# Patient Record
Sex: Female | Born: 1970 | Race: Black or African American | Hispanic: No | Marital: Married
Health system: Southern US, Community
[De-identification: ages and names within clinical notes are randomized; demographics above are authoritative.]

## PROBLEM LIST (undated history)

## (undated) DIAGNOSIS — E119 Type 2 diabetes mellitus without complications: Secondary | ICD-10-CM

## (undated) DIAGNOSIS — I5030 Unspecified diastolic (congestive) heart failure: Secondary | ICD-10-CM

## (undated) DIAGNOSIS — I10 Essential (primary) hypertension: Secondary | ICD-10-CM

## (undated) HISTORY — DX: Essential (primary) hypertension: I10

## (undated) HISTORY — DX: Unspecified diastolic (congestive) heart failure: I50.30

## (undated) HISTORY — DX: Type 2 diabetes mellitus without complications: E11.9

---

## 1998-01-07 ENCOUNTER — Other Ambulatory Visit: Admission: RE | Admit: 1998-01-07 | Discharge: 1998-01-07 | Payer: Self-pay | Admitting: Family Medicine

## 2015-07-02 ENCOUNTER — Other Ambulatory Visit (HOSPITAL_COMMUNITY): Payer: Self-pay | Admitting: Internal Medicine

## 2015-07-02 DIAGNOSIS — I10 Essential (primary) hypertension: Secondary | ICD-10-CM

## 2015-07-03 ENCOUNTER — Ambulatory Visit (HOSPITAL_COMMUNITY)
Admission: RE | Admit: 2015-07-03 | Discharge: 2015-07-03 | Disposition: A | Discharge status: Home / Self Care | Payer: Managed Care, Other (non HMO) | Source: Ambulatory Visit | Attending: Plastic Surgery | Admitting: Plastic Surgery

## 2015-07-03 DIAGNOSIS — I10 Essential (primary) hypertension: Secondary | ICD-10-CM | POA: Insufficient documentation

## 2015-07-03 NOTE — Progress Notes (Signed)
*  PRELIMINARY RESULTS* Vascular Ultrasound Renal Artery Duplex has been completed.  Preliminary findings: No evidence of renal artery stenosis.    Farrel DemarkJill Eunice, RDMS, RVT  07/03/2015, 9:43 AM

## 2020-03-16 ENCOUNTER — Other Ambulatory Visit: Payer: Self-pay

## 2020-03-16 ENCOUNTER — Other Ambulatory Visit: Payer: Self-pay | Admitting: Internal Medicine

## 2020-03-16 ENCOUNTER — Ambulatory Visit
Admission: RE | Admit: 2020-03-16 | Discharge: 2020-03-16 | Disposition: A | Discharge status: Home / Self Care | Payer: Commercial Managed Care - PPO | Source: Ambulatory Visit | Attending: Internal Medicine | Admitting: Internal Medicine

## 2020-03-16 DIAGNOSIS — Z1231 Encounter for screening mammogram for malignant neoplasm of breast: Secondary | ICD-10-CM

## 2021-01-19 ENCOUNTER — Other Ambulatory Visit: Payer: Self-pay | Admitting: Internal Medicine

## 2021-01-19 ENCOUNTER — Other Ambulatory Visit: Payer: Self-pay

## 2021-01-19 ENCOUNTER — Ambulatory Visit: Admission: RE | Admit: 2021-01-19 | Payer: Commercial Managed Care - PPO | Source: Ambulatory Visit

## 2021-01-19 DIAGNOSIS — Z1231 Encounter for screening mammogram for malignant neoplasm of breast: Secondary | ICD-10-CM

## 2021-06-23 ENCOUNTER — Ambulatory Visit
Admission: RE | Admit: 2021-06-23 | Discharge: 2021-06-23 | Disposition: A | Discharge status: Home / Self Care | Payer: Commercial Managed Care - PPO | Source: Ambulatory Visit | Attending: Internal Medicine | Admitting: Internal Medicine

## 2021-06-23 ENCOUNTER — Other Ambulatory Visit: Payer: Self-pay

## 2021-06-23 DIAGNOSIS — Z1231 Encounter for screening mammogram for malignant neoplasm of breast: Secondary | ICD-10-CM

## 2022-08-09 ENCOUNTER — Other Ambulatory Visit: Payer: Self-pay | Admitting: Internal Medicine

## 2022-08-09 DIAGNOSIS — Z1231 Encounter for screening mammogram for malignant neoplasm of breast: Secondary | ICD-10-CM

## 2022-08-23 ENCOUNTER — Ambulatory Visit
Admission: RE | Admit: 2022-08-23 | Discharge: 2022-08-23 | Disposition: A | Discharge status: Home / Self Care | Payer: BC Managed Care – PPO | Source: Ambulatory Visit | Attending: Internal Medicine | Admitting: Internal Medicine

## 2022-08-23 DIAGNOSIS — Z1231 Encounter for screening mammogram for malignant neoplasm of breast: Secondary | ICD-10-CM

## 2022-11-26 IMAGING — MG DIGITAL SCREENING BILAT W/ TOMO W/ CAD
8 series · 8 of 24 positions shown · non-contrast
Comparison: Previous exam(s).

CLINICAL DATA: Screening.

EXAM:
DIGITAL SCREENING BILATERAL MAMMOGRAM WITH TOMO AND CAD

[L MLO synth-2D]
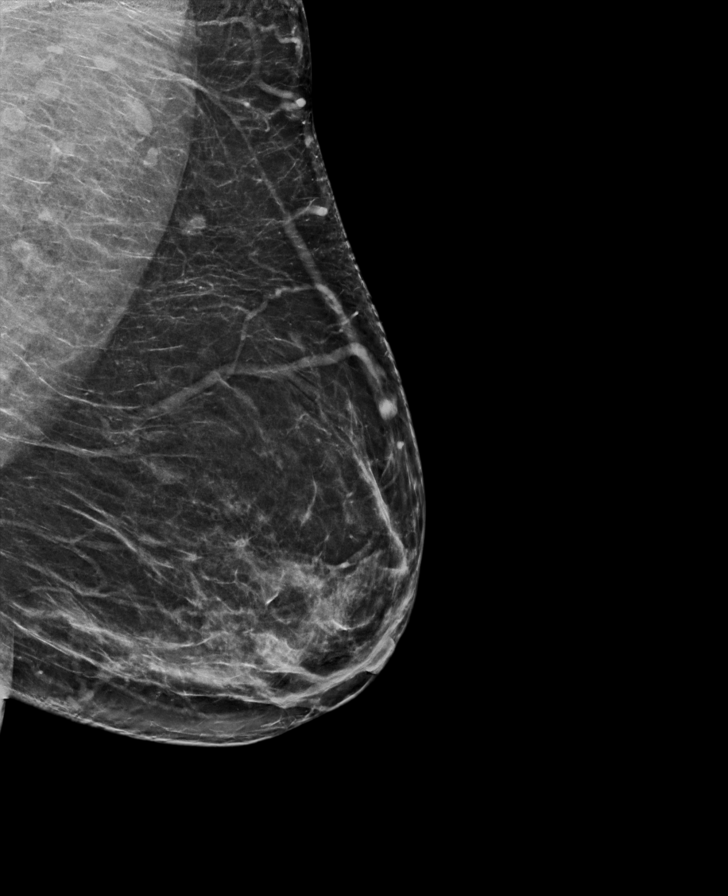

[L CC synth-2D]
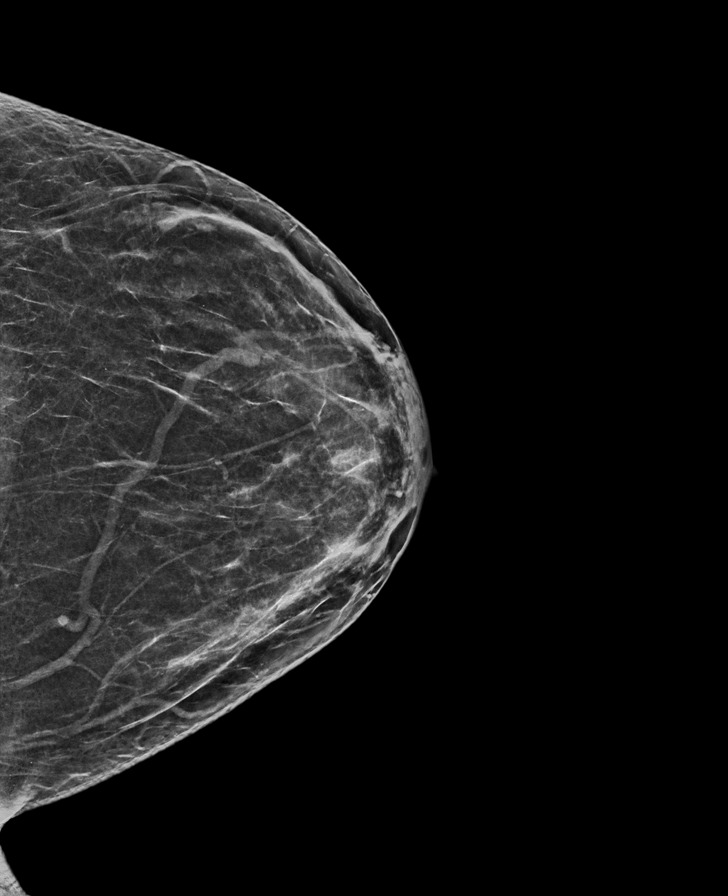

[R MLO synth-2D]
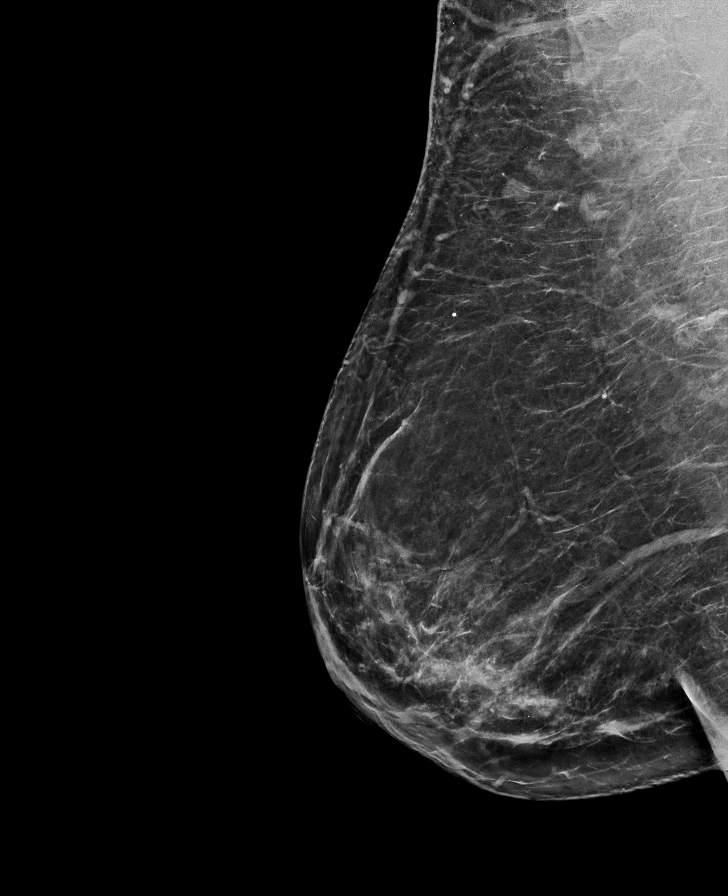

[R CC synth-2D]
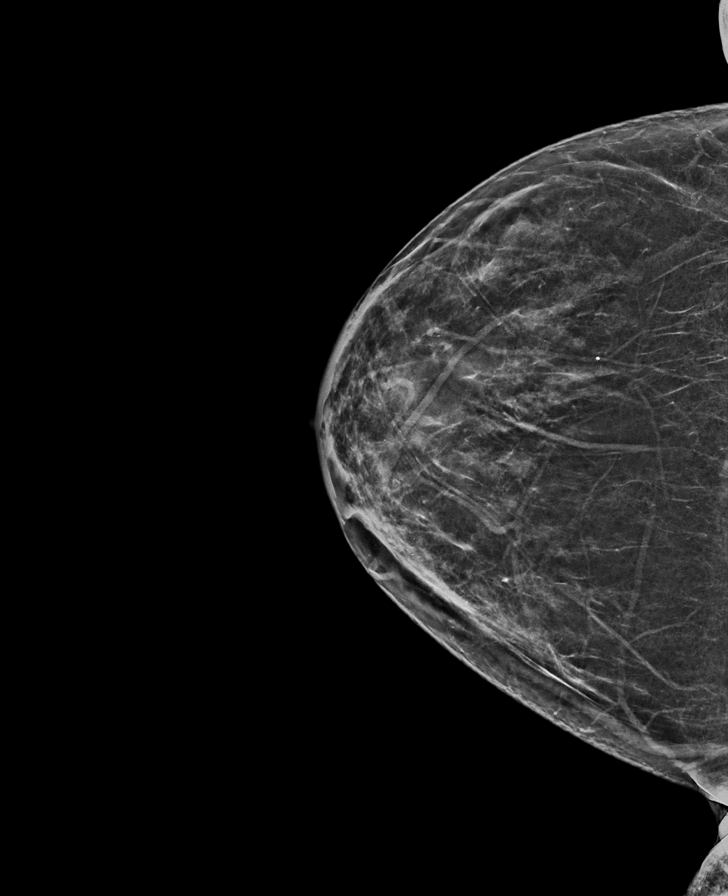

[L MLO tomo · tomo slice 35/70.0]
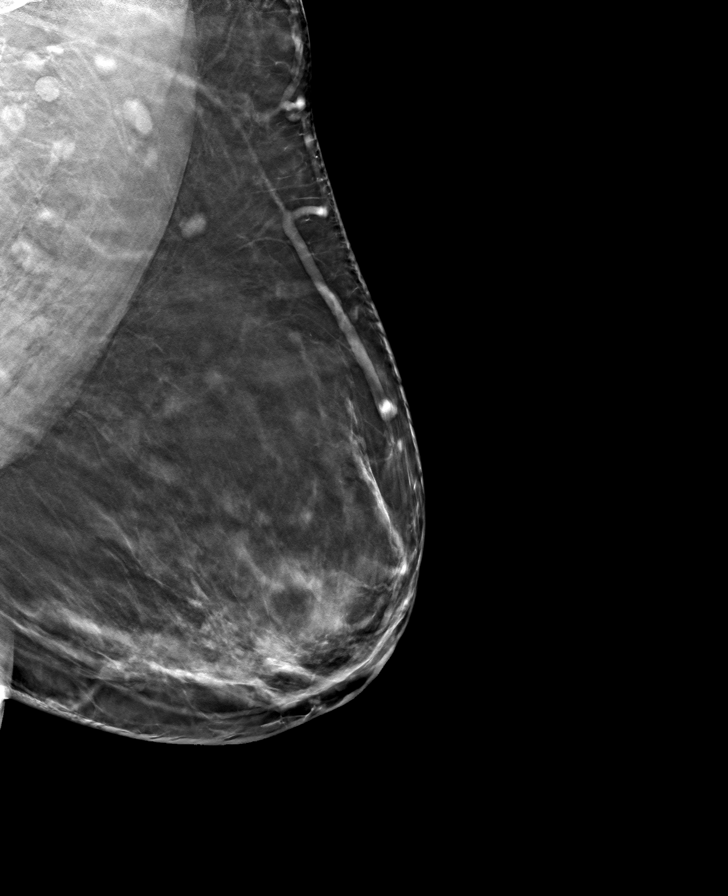

[R MLO tomo · tomo slice 38/75.0]
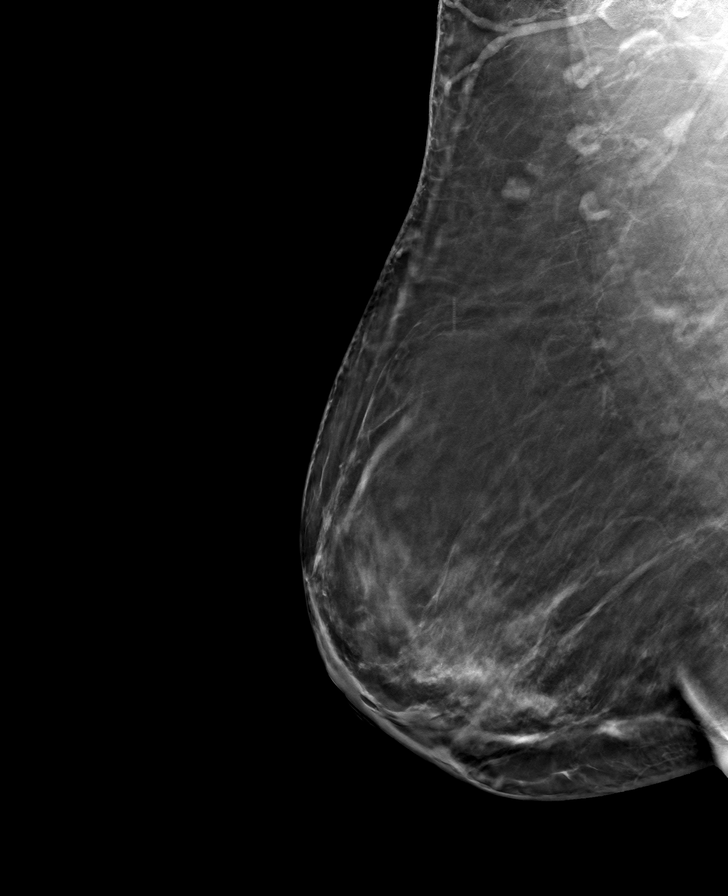

[R CC tomo · tomo slice 31/60.0]
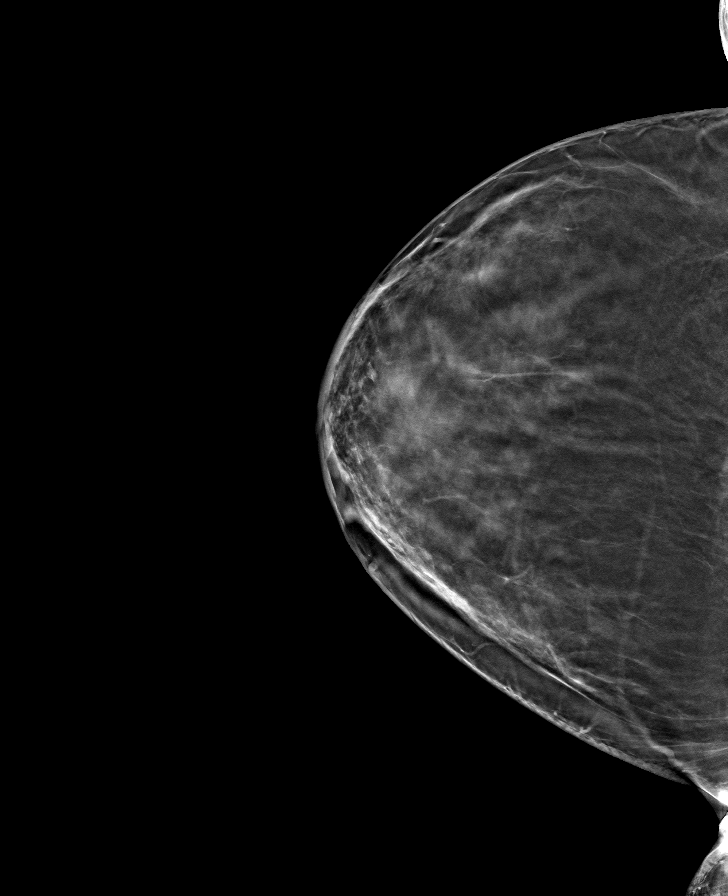

[L CC tomo · tomo slice 29/58.0]
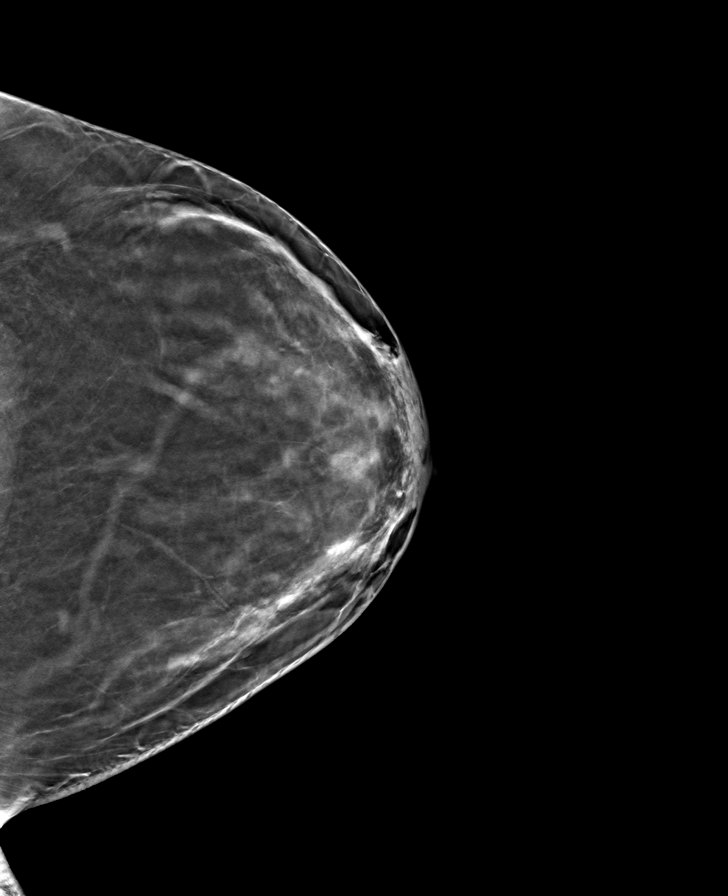

[8 of 24 positions shown; findings below may reference images not displayed]

ACR Breast Density Category b: There are scattered areas of
fibroglandular density.
FINDINGS: There are no findings suspicious for malignancy. Images were
processed with CAD.
IMPRESSION: No mammographic evidence of malignancy. A result letter of this
screening mammogram will be mailed directly to the patient.

RECOMMENDATION:
Screening mammogram in one year. (Code:[id number])

BI-RADS CATEGORY  1: Negative.

## 2023-03-06 NOTE — Progress Notes (Signed)
Update medical, family and medications.

## 2023-03-14 ENCOUNTER — Ambulatory Visit: Payer: BC Managed Care – PPO | Attending: Internal Medicine | Admitting: Internal Medicine

## 2023-03-14 NOTE — Progress Notes (Signed)
Erroneous encounter - please disregard.

## 2023-12-05 ENCOUNTER — Other Ambulatory Visit: Payer: Self-pay | Admitting: Internal Medicine

## 2023-12-05 DIAGNOSIS — Z1231 Encounter for screening mammogram for malignant neoplasm of breast: Secondary | ICD-10-CM

## 2023-12-12 ENCOUNTER — Ambulatory Visit
Admission: RE | Admit: 2023-12-12 | Discharge: 2023-12-12 | Disposition: A | Discharge status: Home / Self Care | Source: Ambulatory Visit | Attending: Family Medicine | Admitting: Family Medicine

## 2023-12-12 DIAGNOSIS — Z1231 Encounter for screening mammogram for malignant neoplasm of breast: Secondary | ICD-10-CM
